# Patient Record
Sex: Male | Born: 2005 | Race: White | Hispanic: Yes | Marital: Single | Smoking: Never smoker
Health system: Southern US, Community
[De-identification: ages and names within clinical notes are randomized; demographics above are authoritative.]

---
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
## 2024-03-25 ENCOUNTER — Ambulatory Visit: Admitting: Pediatrics

## 2024-03-25 ENCOUNTER — Encounter: Payer: Self-pay | Admitting: Pediatrics

## 2024-03-25 ENCOUNTER — Other Ambulatory Visit (HOSPITAL_COMMUNITY)
Admission: RE | Admit: 2024-03-25 | Discharge: 2024-03-25 | Disposition: A | Discharge status: Home / Self Care | Source: Ambulatory Visit | Attending: Pediatrics | Admitting: Pediatrics

## 2024-03-25 VITALS — BP 104/66 | Ht 74.96 in | Wt 174.6 lb

## 2024-03-25 DIAGNOSIS — Z114 Encounter for screening for human immunodeficiency virus [HIV]: Secondary | ICD-10-CM

## 2024-03-25 DIAGNOSIS — Z1331 Encounter for screening for depression: Secondary | ICD-10-CM | POA: Diagnosis not present

## 2024-03-25 DIAGNOSIS — Z00129 Encounter for routine child health examination without abnormal findings: Secondary | ICD-10-CM

## 2024-03-25 DIAGNOSIS — Z113 Encounter for screening for infections with a predominantly sexual mode of transmission: Secondary | ICD-10-CM | POA: Insufficient documentation

## 2024-03-25 DIAGNOSIS — Z68.41 Body mass index (BMI) pediatric, 5th percentile to less than 85th percentile for age: Secondary | ICD-10-CM

## 2024-03-25 DIAGNOSIS — Z9189 Other specified personal risk factors, not elsewhere classified: Secondary | ICD-10-CM | POA: Diagnosis not present

## 2024-03-25 DIAGNOSIS — Z1339 Encounter for screening examination for other mental health and behavioral disorders: Secondary | ICD-10-CM | POA: Diagnosis not present

## 2024-03-25 DIAGNOSIS — Z23 Encounter for immunization: Secondary | ICD-10-CM | POA: Diagnosis not present

## 2024-03-25 DIAGNOSIS — Z0001 Encounter for general adult medical examination with abnormal findings: Secondary | ICD-10-CM

## 2024-03-25 DIAGNOSIS — Z13220 Encounter for screening for lipoid disorders: Secondary | ICD-10-CM

## 2024-03-25 LAB — POCT RAPID HIV: Rapid HIV, POC: NEGATIVE

## 2024-03-25 NOTE — Patient Instructions (Signed)
  As your medical provider, it is important to me that you continue to receive high-quality primary care services as you transition to adulthood.  Below is a list of adult medicine practices that are currently accepting new patients.  Please reach out to one of these practices to schedule a new patient appointment as soon as possible.    Adult Primary Care Clinics Name [facility]  Address: [place], [place] [address]  Phone: [phone] Hours: Monday - Friday 9 AM -6 PM  Types of insurance accepted:  Marland Kitchen Pharmacist, community . [place] (orange card) . Medicaid . Medicare . Uninsured  Language services:  Marland Kitchen Video and phone interpreters available   Ages 32 and older    . Adult primary care . Onsite pharmacy . Integrated behavioral health . Financial assistance counseling . Walk-in hours for established patients  Financial assistance counseling hours: Tuesdays 2:00PM - 5:00PM  Thursday 8:30AM - 4:30PM  Space is limited, 10 on Tuesday and 20 on Thursday on a first come, first serve basis  Name [facility]  Address: [address] [place], [place] [address]  Phone: [phone]  Hours: Monday - Friday 8:30 AM - 5 PM  Types of insurance accepted:  Marland Kitchen Pharmacist, community . Medicaid . Medicare . Uninsured  Language services:  Marland Kitchen Video and phone interpreters available   All ages - newborn to adult   . Primary care for all ages (children and adults) . Integrated behavioral health . Nutritionist . Financial assistance counseling   Name [facility] on the ground floor of [facility]  Address: [address],  [place]  [address]  Phone: [phone]  Hours: Monday - Friday 8:15 AM - 5 PM  Types of insurance accepted:  Marland Kitchen Sports coach . Medicaid . Medicare . Uninsured  Language services:  Marland Kitchen Video and phone interpreters available   Ages 106 and older   . Adult primary care . Nutritionist . Certified Diabetes Educator  . Integrated behavioral health . Financial assistance counseling   Name [facility]  Address: [address] [place], [place] [address]  Phone: [phone]  Hours: Monday - Friday 8:30 AM - 5 PM    Types of insurance accepted:  Marland Kitchen Pharmacist, community . Medicaid . Medicare . Uninsured  Language services:  Marland Kitchen Video and phone interpreters available   All ages - newborn to adult   . Primary care for all ages (children and adults) . Integrated behavioral health . Financial assistance counseling

## 2024-03-25 NOTE — Progress Notes (Signed)
 Adolescent Well Care Visit Dolph Tavano is a 18 y.o. male who is here for well care.     PCP:  Benard Brackett, MD   History was provided by the patient.  Confidentiality was discussed with the patient and, if applicable, with caregiver as well.  Patient's personal or confidential phone number: [phone]   Current Issues: Current concerns include: none.   Things are going well!  Nutrition: Nutrition/Eating Behaviors: Well-balanced diet and eating 3 meals a day.  Adequate calcium in diet?: yes Supplements/ Vitamins: none  Exercise/ Media: Play any Sports?:  none Exercise:  goes to gym. Lift weights 5-6 times per week.  Screen Time:  > 2 hours-counseling provided Media Rules or Monitoring?: no  Sleep:  Sleep: Usually goes to sleep around 11pm and wakes up around 8am.  Social Screening: Lives with:  Mom, Dad, and two siblings (42 yo and 45 yo) Parental relations:  good Activities, Work, and Regulatory affairs officer?: Is going to work for the summer at [employer]  Concerns regarding behavior with peers?  no Stressors of note: no  Education: School Name: Graduated high school yesterday! Went to [employer]: doing well; no concerns  Going to go to [employer] for college and study Thomasina Fletcher!  Patient has a dental home: yes, has an appointment in a month   Confidential social history: Tobacco?  no Secondhand smoke exposure?  yes, but not around them frequently Drugs/ETOH?  no  Sexually Active?  no   Pregnancy Prevention: abstinence  Safe at home, in school & in relationships?  Yes Safe to self?  Yes   Screenings:  The patient completed the Rapid Assessment for Adolescent Preventive Services screening questionnaire and the following topics were identified as risk factors and discussed: seatbelt use and screen time   In addition, the following topics were discussed as part of anticipatory guidance exercise, condom use, birth control, and screen  time.  PHQ-9 completed and results indicated no concerns for depression  Physical Exam:  Vitals:   03/25/24 1327  BP: 104/66  Weight: 174 lb 9.6 oz (79.2 kg)  Height: 6' 2.96 (1.904 m)   BP 104/66 (BP Location: Right Arm, Patient Position: Sitting, Cuff Size: Normal)   Ht 6' 2.96 (1.904 m)   Wt 174 lb 9.6 oz (79.2 kg)   BMI 21.85 kg/m  Body mass index: body mass index is 21.85 kg/m. Blood pressure %iles are not available for patients who are 18 years or older.  Hearing Screening  Method: Audiometry   500Hz  1000Hz  2000Hz  4000Hz   Right ear 25 20 20 20   Left ear 25 20 20 20    Vision Screening   Right eye Left eye Both eyes  Without correction     With correction 20/16 20/16 20/16     Physical Exam Vitals reviewed.  Constitutional:      General: He is not in acute distress.    Appearance: Normal appearance. He is normal weight. He is not ill-appearing.  HENT:     Head: Normocephalic and atraumatic.     Right Ear: Tympanic membrane, ear canal and external ear normal.     Left Ear: Tympanic membrane, ear canal and external ear normal.     Nose: Nose normal.     Mouth/Throat:     Mouth: Mucous membranes are moist.     Pharynx: Oropharynx is clear. No oropharyngeal exudate or posterior oropharyngeal erythema.   Eyes:     Extraocular Movements: Extraocular movements intact.  Conjunctiva/sclera: Conjunctivae normal.     Pupils: Pupils are equal, round, and reactive to light.    Cardiovascular:     Rate and Rhythm: Normal rate and regular rhythm.     Pulses: Normal pulses.     Heart sounds: Normal heart sounds. No murmur heard. Pulmonary:     Effort: Pulmonary effort is normal.     Breath sounds: Normal breath sounds.  Abdominal:     General: Abdomen is flat.     Palpations: Abdomen is soft. There is no mass.     Tenderness: There is no abdominal tenderness.  Genitourinary:    Comments: Deferred by patient  Musculoskeletal:        General: Normal range of  motion.     Cervical back: Normal range of motion and neck supple.  Lymphadenopathy:     Cervical: No cervical adenopathy.   Skin:    General: Skin is warm and dry.     Capillary Refill: Capillary refill takes less than 2 seconds.     Findings: No rash.   Neurological:     General: No focal deficit present.     Mental Status: He is alert.   Psychiatric:        Mood and Affect: Mood normal.        Behavior: Behavior normal.    Assessment and Plan:   Frank Colon is an 18 yo M presenting for a well-child check.  Counseling provided for all of the vaccine components  Orders Placed This Encounter  Procedures   Meningococcal B, OMV   Lipid panel   Hemoglobin A1c   POCT Rapid HIV   1. Encounter for routine child health examination without abnormal findings (Primary) - Development appropriate for age - Hearing screening result: normal - Vision screening result: normal - RAAPs and PHQ-9 within normal limits - GU exam deferred by patient for this visit. Discussed self exam and signs warranting exam by healthcare provider - Discussed transitioning to adult healthcare provider and gave list of adult primary care providers in the area.  2. BMI (body mass index), pediatric, 5% to less than 85% for age - BMI is appropriate for age Counseled regarding 5-2-1-0 goals of healthy active living including:  - eating at least 5 fruits and vegetables a day - at least 1 hour of activity - no sugary beverages - eating three meals each day with age-appropriate servings - age-appropriate screen time  - age-appropriate sleep patterns   3. Need for vaccination - Meningococcal B, OMV  4. Screening for lipid disorders 5. At risk of diabetes mellitus - Lipid panel; Future - Hemoglobin A1c; Future  6. Screening for human immunodeficiency virus - POCT Rapid HIV - negative  7. Screening examination for venereal disease - Urine cytology ancillary only    Return for Vaccine visit in 6 months for  meningococcal B vaccine and 18 yo WCC with Dr. Johnathan Myron.  Ettie Hermanns, MD

## 2024-03-28 ENCOUNTER — Other Ambulatory Visit: Payer: Self-pay | Admitting: Pediatrics

## 2024-03-28 DIAGNOSIS — J301 Allergic rhinitis due to pollen: Secondary | ICD-10-CM

## 2024-03-28 LAB — URINE CYTOLOGY ANCILLARY ONLY
Chlamydia: NEGATIVE
Comment: NEGATIVE
Comment: NORMAL
Neisseria Gonorrhea: NEGATIVE

## 2024-08-29 ENCOUNTER — Encounter: Payer: Self-pay | Admitting: Pediatrics

## 2024-09-01 ENCOUNTER — Other Ambulatory Visit: Payer: Self-pay

## 2024-09-01 DIAGNOSIS — N6310 Unspecified lump in the right breast, unspecified quadrant: Secondary | ICD-10-CM

## 2024-09-21 ENCOUNTER — Other Ambulatory Visit: Payer: Self-pay

## 2024-09-21 ENCOUNTER — Ambulatory Visit
Admission: RE | Admit: 2024-09-21 | Discharge: 2024-09-21 | Disposition: A | Discharge status: Home / Self Care | Source: Ambulatory Visit

## 2024-09-21 DIAGNOSIS — N63 Unspecified lump in unspecified breast: Secondary | ICD-10-CM

## 2024-09-21 DIAGNOSIS — N6341 Unspecified lump in right breast, subareolar: Secondary | ICD-10-CM | POA: Diagnosis not present

## 2024-09-21 DIAGNOSIS — N6310 Unspecified lump in the right breast, unspecified quadrant: Secondary | ICD-10-CM

## 2024-09-26 ENCOUNTER — Telehealth: Payer: Self-pay | Admitting: Pediatrics

## 2024-09-26 NOTE — Telephone Encounter (Signed)
 A medical records request was received from [facility] and forwarded to the HIM Department - ROI Team.

## 2024-09-27 ENCOUNTER — Inpatient Hospital Stay: Admission: RE | Admit: 2024-09-27 | Discharge: 2024-09-27

## 2024-09-27 DIAGNOSIS — N6011 Diffuse cystic mastopathy of right breast: Secondary | ICD-10-CM | POA: Diagnosis not present

## 2024-09-27 DIAGNOSIS — N6310 Unspecified lump in the right breast, unspecified quadrant: Secondary | ICD-10-CM

## 2024-09-27 DIAGNOSIS — N63 Unspecified lump in unspecified breast: Secondary | ICD-10-CM

## 2024-09-27 DIAGNOSIS — N6341 Unspecified lump in right breast, subareolar: Secondary | ICD-10-CM | POA: Diagnosis not present

## 2024-09-27 HISTORY — PX: BREAST BIOPSY: SHX20

## 2024-09-28 LAB — SURGICAL PATHOLOGY
